# Patient Record
Sex: Male | Born: 1995 | Race: Black or African American | Hispanic: No | Marital: Single | Smoking: Never smoker
Health system: Southern US, Community
[De-identification: ages and names within clinical notes are randomized; demographics above are authoritative.]

## PROBLEM LIST (undated history)

## (undated) HISTORY — PX: TONSILLECTOMY: SUR1361

---
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
## 2018-07-10 ENCOUNTER — Other Ambulatory Visit: Payer: Self-pay

## 2018-07-10 ENCOUNTER — Encounter (HOSPITAL_BASED_OUTPATIENT_CLINIC_OR_DEPARTMENT_OTHER): Payer: Self-pay

## 2018-07-10 ENCOUNTER — Emergency Department (HOSPITAL_BASED_OUTPATIENT_CLINIC_OR_DEPARTMENT_OTHER)
Admission: EM | Admit: 2018-07-10 | Discharge: 2018-07-10 | Disposition: A | Discharge status: Home / Self Care | Payer: Self-pay | Attending: Emergency Medicine | Admitting: Emergency Medicine

## 2018-07-10 ENCOUNTER — Emergency Department (HOSPITAL_BASED_OUTPATIENT_CLINIC_OR_DEPARTMENT_OTHER): Payer: Self-pay

## 2018-07-10 DIAGNOSIS — M7712 Lateral epicondylitis, left elbow: Secondary | ICD-10-CM | POA: Insufficient documentation

## 2018-07-10 DIAGNOSIS — M778 Other enthesopathies, not elsewhere classified: Secondary | ICD-10-CM

## 2018-07-10 MED ORDER — IBUPROFEN 200 MG PO TABS
600.0000 mg | ORAL_TABLET | Freq: Once | ORAL | Status: AC
  Administered 2018-07-10: 600 mg via ORAL

## 2018-07-10 MED ORDER — IBUPROFEN 600 MG PO TABS: 600.0000 mg | ORAL_TABLET | Freq: Four times a day (QID) | ORAL | 0 refills | Status: AC | PRN

## 2018-07-10 MED ORDER — IBUPROFEN 400 MG PO TABS
ORAL_TABLET | ORAL | Status: AC
  Filled 2018-07-10: qty 1

## 2018-07-10 MED ORDER — IBUPROFEN 200 MG PO TABS
ORAL_TABLET | ORAL | Status: AC
  Filled 2018-07-10: qty 1

## 2018-07-10 NOTE — ED Triage Notes (Signed)
C/o pain to left elbow that started while lifting at work 2 days ago-NAD-steady gait

## 2018-07-10 NOTE — ED Provider Notes (Signed)
[facility] EMERGENCY DEPARTMENT Provider Note   CSN: [id number] Arrival date & time: 07/10/18  1918     [id number]   Chief Complaint Chief Complaint  Patient presents with  . Elbow Injury    HPI Marcus Krueger is a 22 y.o. male.  HPI  22 year old who comes in with chief complaint of elbow pain. Patient reports that he started noticing pain over his elbow 2 days ago.  His pain is worse with flexion and extension midway through the range of motion.  He does not recall any traumatic event and denies any significant lifting.  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  The pain is located over his left elbow and he is right-handed.  History reviewed. No pertinent past medical history.  There are no active problems to display for this patient.   Past Surgical History:  Procedure Laterality Date  . TONSILLECTOMY          Home Medications    Prior to Admission medications   Medication Sig Start Date End Date Taking? Authorizing Provider  ibuprofen (ADVIL,MOTRIN) 600 MG tablet Take 1 tablet (600 mg total) by mouth every 6 (six) hours as needed. 07/10/18   Derwood KaplanNanavati, Shuayb Schepers, MD    Family History No family history on file.  Social History Social History   Tobacco Use  . Smoking status: Never Smoker  . Smokeless tobacco: Never Used  Substance Use Topics  . Alcohol use: Never    Frequency: Never  . Drug use: Never     Allergies   Patient has no known allergies.   Review of Systems Review of Systems  Constitutional: Positive for activity change.  Musculoskeletal: Positive for arthralgias.     Physical Exam Updated Vital Signs BP (!) 163/107 (BP Location: Left Arm)   Pulse 85   Temp 98.8 F (37.1 C) (Oral)   Resp 18   Ht 6\' 4"  (1.93 m)   Wt (!) 145.9 kg   SpO2 100%   BMI 39.16 kg/m   Physical Exam  Constitutional: He is oriented to person, place, and time. He appears well-developed.  HENT:  Head: Atraumatic.    Neck: Neck supple.  Cardiovascular: Normal rate.  Pulmonary/Chest: Effort normal.  Musculoskeletal:  On exam patient does not have any reproducible tenderness over the lateral or medial epicondyle with palpation. Patient able to pronate and supinate without any difficulty. No deformity or edema appreciated with the elbow  Neurological: He is alert and oriented to person, place, and time.  Skin: Skin is warm.  Nursing note and vitals reviewed.    ED Treatments / Results  Labs (all labs ordered are listed, but only abnormal results are displayed) Labs Reviewed - No data to display  EKG None  Radiology Dg Elbow Complete Left  Result Date: 07/10/2018 CLINICAL DATA:  Acute onset left elbow pain at end of shift 2 days ago. EXAM: LEFT ELBOW - COMPLETE 3+ VIEW COMPARISON:  None. FINDINGS: There is no evidence of fracture, dislocation, or joint effusion. There is no evidence of arthropathy or other focal bone abnormality. Soft tissues are unremarkable. IMPRESSION: No fracture, joint dislocation or effusion. No suspicious osseous abnormality. Electronically Signed   By: Tollie Ethavid  Kwon M.D.   On: 07/10/2018 20:19    Procedures Procedures (including critical care time)  Medications Ordered in ED Medications  ibuprofen (ADVIL,MOTRIN) 400 MG tablet (has no administration in time range)  ibuprofen (ADVIL,MOTRIN) 200 MG tablet (has no administration in  time range)  ibuprofen (ADVIL,MOTRIN) tablet 600 mg (600 mg Oral Given 07/10/18 2052)     Initial Impression / Assessment and Plan / ED Course  I have reviewed the triage vital signs and the nursing notes.  Pertinent labs & imaging results that were available during my care of the patient were reviewed by me and considered in my medical decision making (see chart for details).     22 year old comes with chief complaint of left elbow pain. Unclear etiology.  X-rays look reassuring.  No concern for septic arthritis or severe tendinitis.   Conservative treatment started.  Follow-up with Dr. Pearletha Forge if the pain is not getting better. Final Clinical Impressions(s) / ED Diagnoses   Final diagnoses:  Left elbow tendinitis    ED Discharge Orders         Ordered    ibuprofen (ADVIL,MOTRIN) 600 MG tablet  Every 6 hours PRN     07/10/18 2055           Derwood Kaplan, MD 07/10/18 2100

## 2018-07-10 NOTE — ED Notes (Signed)
Pt unable to reach supervisor to see if drug screen needed

## 2018-07-10 NOTE — Discharge Instructions (Addendum)
Please take ibuprofen and ice the elbow 4 times a day. See Dr. Pearletha ForgeHudnall if you are not getting better after 1 week of conservative therapy.

## 2020-06-17 IMAGING — DX DG ELBOW COMPLETE 3+V*L*
4 series · 4 of 4 positions shown · non-contrast
Comparison: None.

CLINICAL DATA: Acute onset left elbow pain at end of shift 2 days
ago.

EXAM:
LEFT ELBOW - COMPLETE 3+ VIEW

[elbow ap]
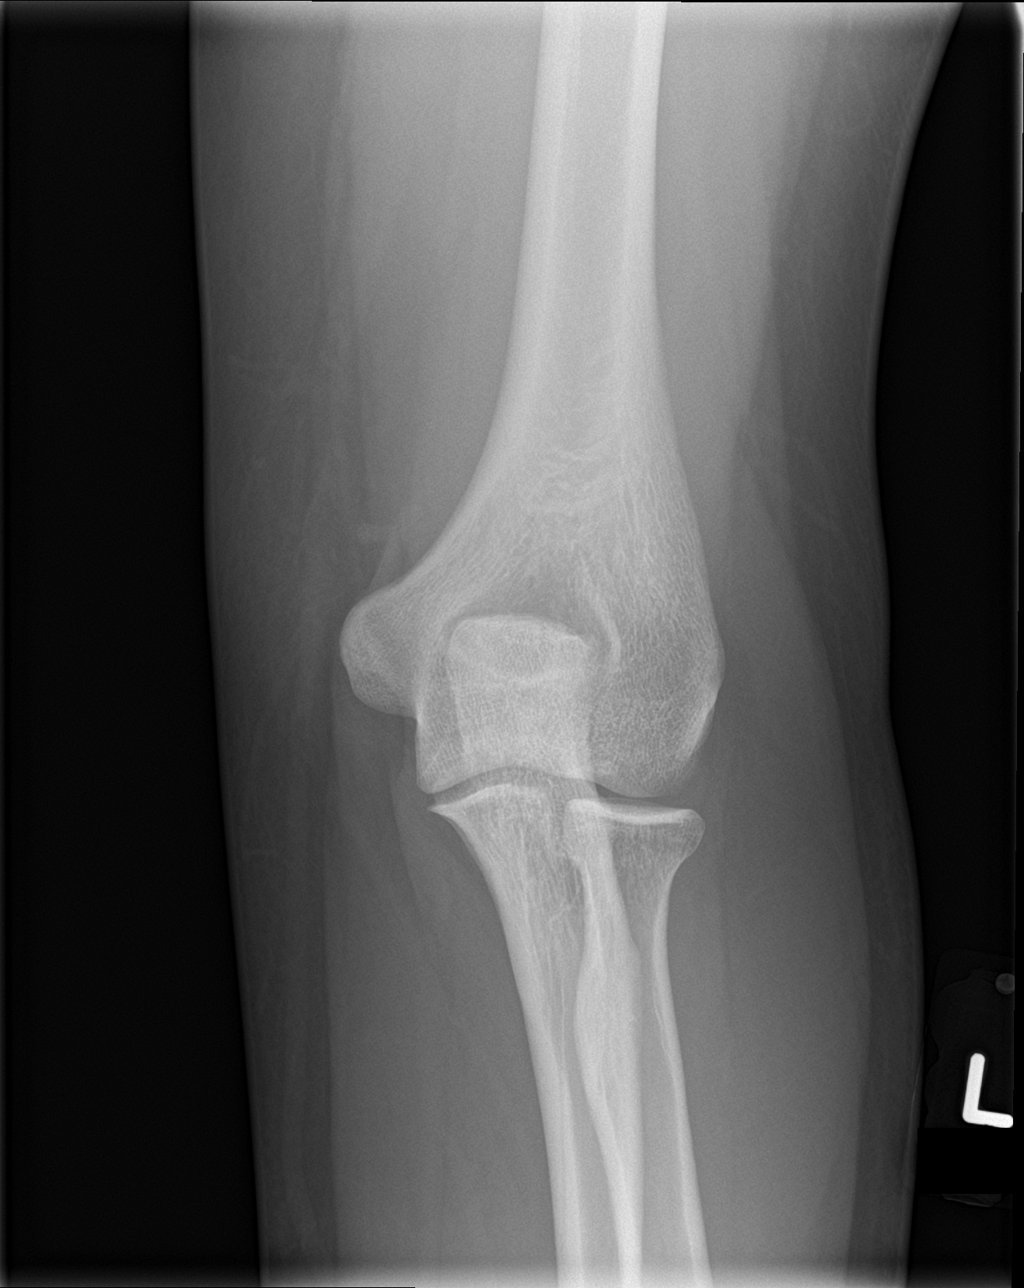

[elbow obl (1 of 2)]
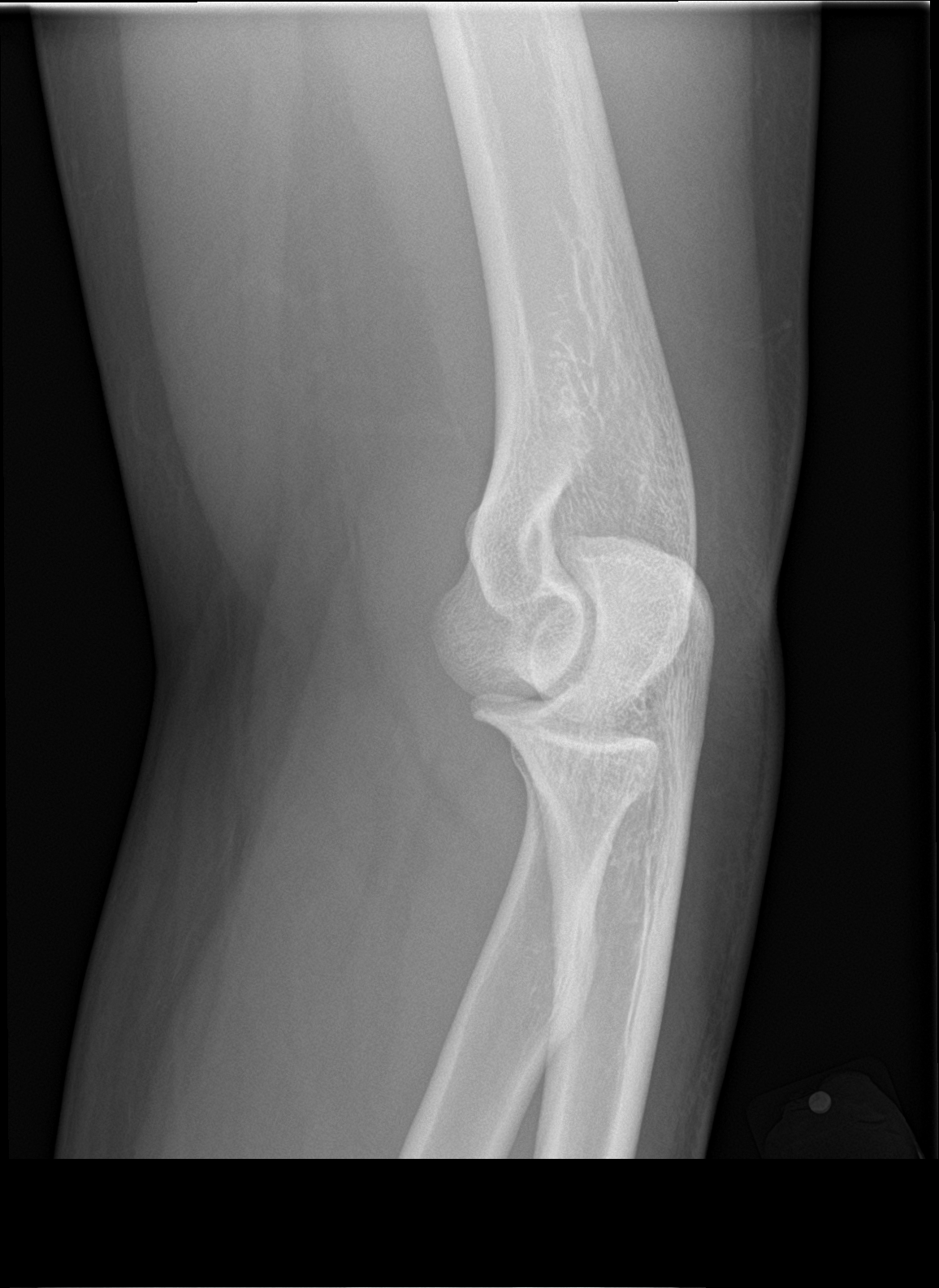

[elbow obl (2 of 2)]
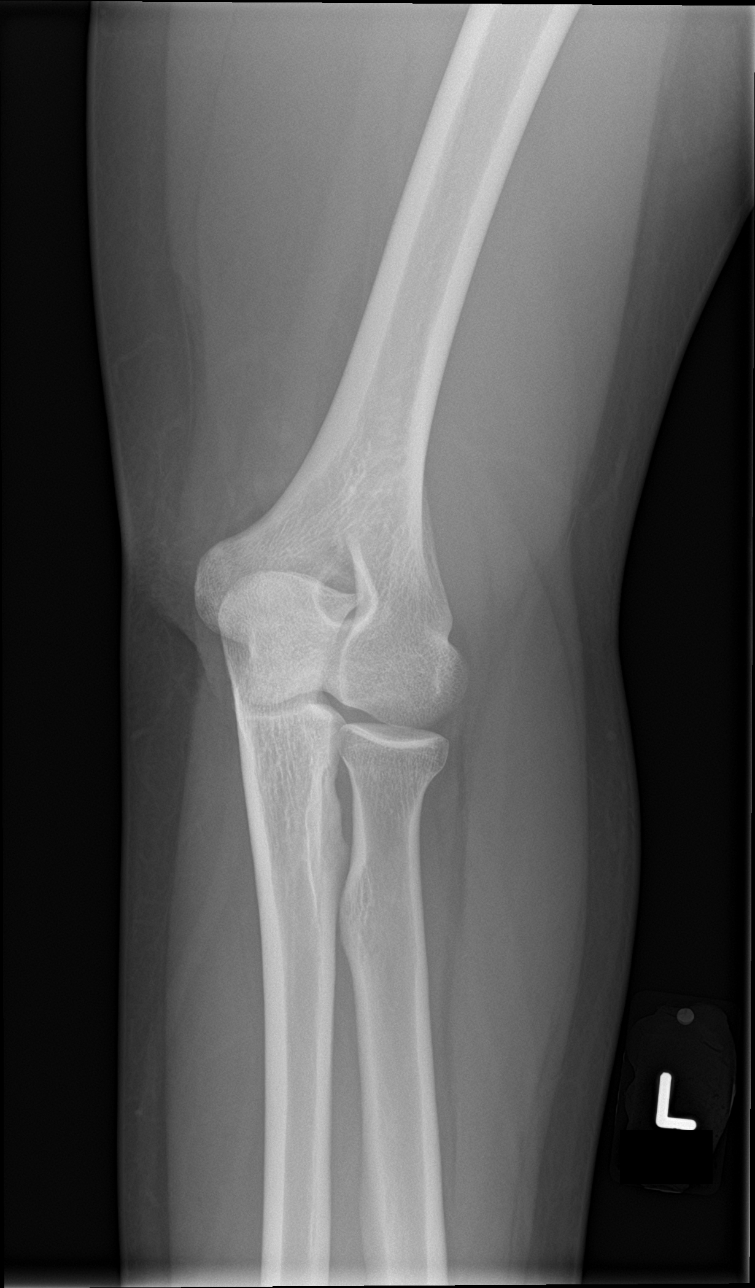

[elbow lat]
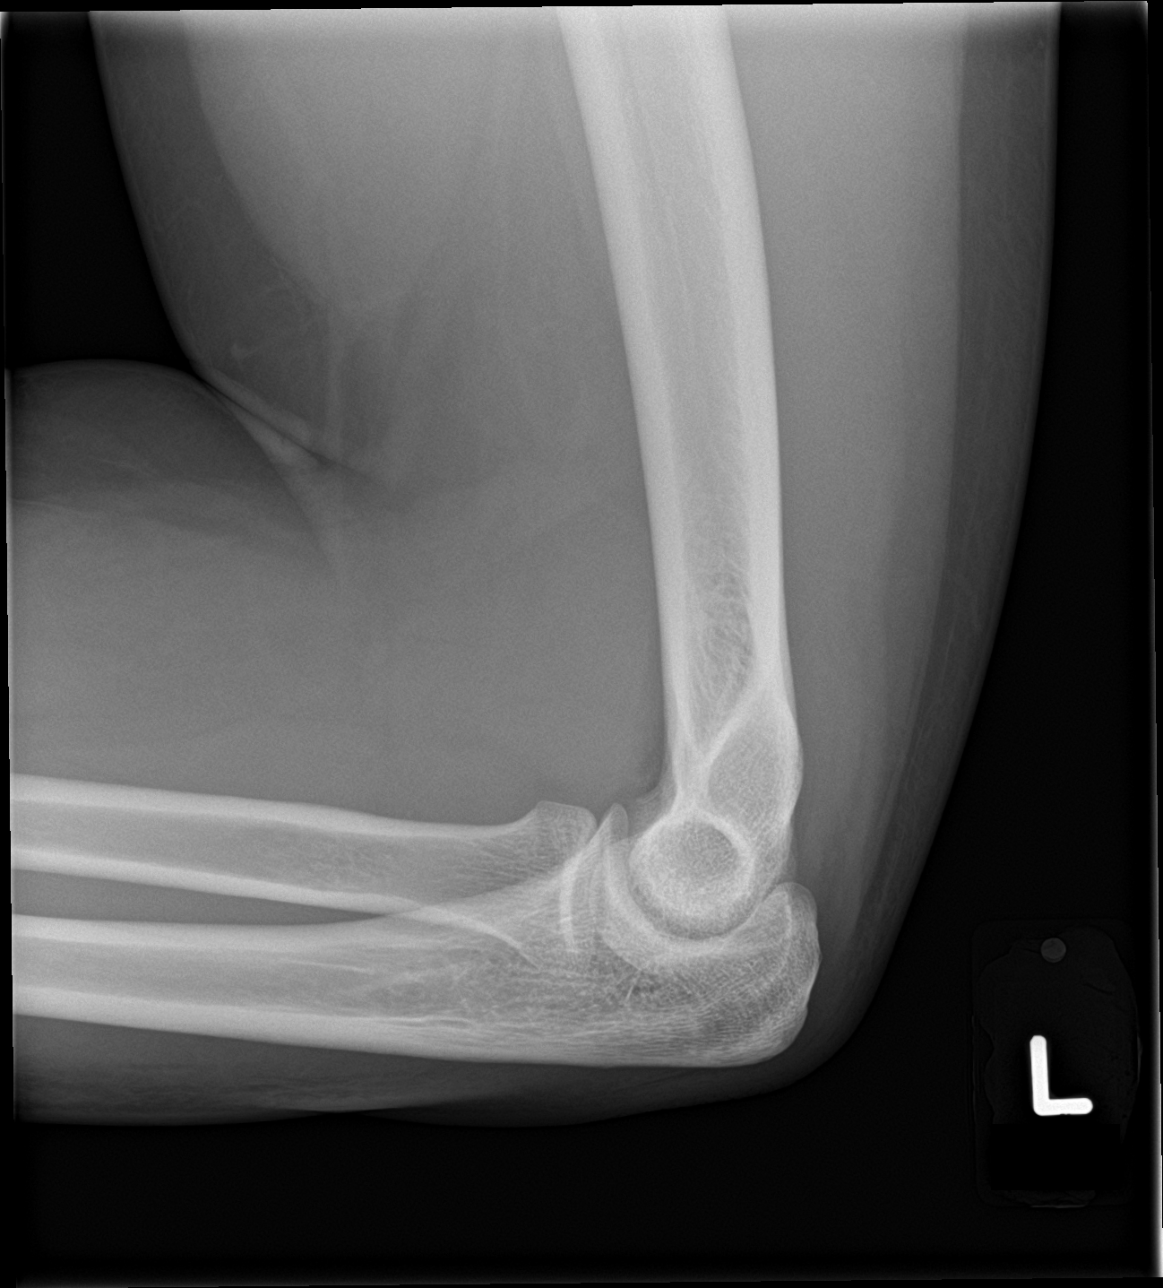

[4 of 4 positions shown; findings below may reference images not displayed]

FINDINGS: There is no evidence of fracture, dislocation, or joint effusion.
There is no evidence of arthropathy or other focal bone abnormality.
Soft tissues are unremarkable.
IMPRESSION: No fracture, joint dislocation or effusion. No suspicious osseous
abnormality.
